# Patient Record
Sex: Female | Born: 1960 | Race: White | Hispanic: No | Marital: Single | Smoking: Former smoker
Health system: Southern US, Community
[De-identification: ages and names within clinical notes are randomized; demographics above are authoritative.]

## PROBLEM LIST (undated history)

## (undated) DIAGNOSIS — Z681 Body mass index (BMI) 19 or less, adult: Secondary | ICD-10-CM

## (undated) DIAGNOSIS — F3289 Other specified depressive episodes: Secondary | ICD-10-CM

## (undated) DIAGNOSIS — J4489 Other specified chronic obstructive pulmonary disease: Secondary | ICD-10-CM

## (undated) DIAGNOSIS — M159 Polyosteoarthritis, unspecified: Secondary | ICD-10-CM

## (undated) DIAGNOSIS — J309 Allergic rhinitis, unspecified: Secondary | ICD-10-CM

## (undated) DIAGNOSIS — K219 Gastro-esophageal reflux disease without esophagitis: Secondary | ICD-10-CM

## (undated) DIAGNOSIS — F329 Major depressive disorder, single episode, unspecified: Secondary | ICD-10-CM

## (undated) DIAGNOSIS — K589 Irritable bowel syndrome without diarrhea: Secondary | ICD-10-CM

## (undated) DIAGNOSIS — F411 Generalized anxiety disorder: Secondary | ICD-10-CM

## (undated) DIAGNOSIS — J449 Chronic obstructive pulmonary disease, unspecified: Secondary | ICD-10-CM

## (undated) DIAGNOSIS — C801 Malignant (primary) neoplasm, unspecified: Secondary | ICD-10-CM

## (undated) HISTORY — PX: OTHER SURGICAL HISTORY: SHX169

## (undated) HISTORY — DX: Major depressive disorder, single episode, unspecified: F32.9

## (undated) HISTORY — DX: Irritable bowel syndrome, unspecified: K58.9

## (undated) HISTORY — DX: Irritable bowel syndrome without diarrhea: K58.9

## (undated) HISTORY — DX: Generalized anxiety disorder: F41.1

## (undated) HISTORY — DX: Other specified chronic obstructive pulmonary disease: J44.89

## (undated) HISTORY — DX: Body mass index (BMI) 19.9 or less, adult: Z68.1

## (undated) HISTORY — DX: Other specified depressive episodes: F32.89

## (undated) HISTORY — DX: Allergic rhinitis, unspecified: J30.9

## (undated) HISTORY — DX: Chronic obstructive pulmonary disease, unspecified: J44.9

## (undated) HISTORY — DX: Gastro-esophageal reflux disease without esophagitis: K21.9

## (undated) HISTORY — DX: Polyosteoarthritis, unspecified: M15.9

## (undated) HISTORY — PX: ABDOMINAL HYSTERECTOMY: SHX81

---

## 1997-08-30 ENCOUNTER — Ambulatory Visit (HOSPITAL_COMMUNITY): Admission: RE | Admit: 1997-08-30 | Discharge: 1997-08-30 | Payer: Self-pay | Admitting: Obstetrics

## 1997-09-26 ENCOUNTER — Encounter: Admission: RE | Admit: 1997-09-26 | Discharge: 1997-12-25 | Payer: Self-pay | Admitting: Obstetrics

## 1997-10-21 ENCOUNTER — Inpatient Hospital Stay (HOSPITAL_COMMUNITY): Admission: AD | Admit: 1997-10-21 | Discharge: 1997-10-24 | Payer: Self-pay | Admitting: *Deleted

## 1998-06-12 ENCOUNTER — Ambulatory Visit (HOSPITAL_COMMUNITY): Admission: RE | Admit: 1998-06-12 | Discharge: 1998-06-12 | Payer: Self-pay | Admitting: Family Medicine

## 1999-07-03 ENCOUNTER — Encounter: Admission: RE | Admit: 1999-07-03 | Discharge: 1999-07-03 | Payer: Self-pay | Admitting: Orthopedic Surgery

## 1999-07-03 ENCOUNTER — Encounter: Payer: Self-pay | Admitting: Orthopedic Surgery

## 1999-07-07 ENCOUNTER — Ambulatory Visit (HOSPITAL_BASED_OUTPATIENT_CLINIC_OR_DEPARTMENT_OTHER): Admission: RE | Admit: 1999-07-07 | Discharge: 1999-07-07 | Payer: Self-pay | Admitting: Orthopedic Surgery

## 2000-05-23 ENCOUNTER — Encounter: Payer: Self-pay | Admitting: Emergency Medicine

## 2000-05-23 ENCOUNTER — Emergency Department (HOSPITAL_COMMUNITY): Admission: EM | Admit: 2000-05-23 | Discharge: 2000-05-23 | Payer: Self-pay | Admitting: Emergency Medicine

## 2000-05-23 ENCOUNTER — Encounter (INDEPENDENT_AMBULATORY_CARE_PROVIDER_SITE_OTHER): Payer: Self-pay

## 2001-03-14 ENCOUNTER — Encounter: Payer: Self-pay | Admitting: Emergency Medicine

## 2001-03-14 ENCOUNTER — Emergency Department (HOSPITAL_COMMUNITY): Admission: EM | Admit: 2001-03-14 | Discharge: 2001-03-14 | Payer: Self-pay | Admitting: Emergency Medicine

## 2001-03-30 ENCOUNTER — Encounter (INDEPENDENT_AMBULATORY_CARE_PROVIDER_SITE_OTHER): Payer: Self-pay | Admitting: *Deleted

## 2001-03-30 ENCOUNTER — Encounter (INDEPENDENT_AMBULATORY_CARE_PROVIDER_SITE_OTHER): Payer: Self-pay | Admitting: Specialist

## 2001-03-30 ENCOUNTER — Encounter: Payer: Self-pay | Admitting: Internal Medicine

## 2001-03-30 ENCOUNTER — Ambulatory Visit (HOSPITAL_COMMUNITY): Admission: RE | Admit: 2001-03-30 | Discharge: 2001-03-30 | Payer: Self-pay | Admitting: Internal Medicine

## 2001-05-04 ENCOUNTER — Ambulatory Visit (HOSPITAL_COMMUNITY): Admission: RE | Admit: 2001-05-04 | Discharge: 2001-05-04 | Payer: Self-pay | Admitting: Internal Medicine

## 2001-05-10 ENCOUNTER — Encounter (INDEPENDENT_AMBULATORY_CARE_PROVIDER_SITE_OTHER): Payer: Self-pay | Admitting: Specialist

## 2001-05-10 ENCOUNTER — Encounter: Payer: Self-pay | Admitting: Internal Medicine

## 2001-05-10 ENCOUNTER — Ambulatory Visit (HOSPITAL_COMMUNITY): Admission: RE | Admit: 2001-05-10 | Discharge: 2001-05-10 | Payer: Self-pay | Admitting: Internal Medicine

## 2001-07-10 ENCOUNTER — Encounter (INDEPENDENT_AMBULATORY_CARE_PROVIDER_SITE_OTHER): Payer: Self-pay | Admitting: *Deleted

## 2001-07-10 ENCOUNTER — Encounter: Payer: Self-pay | Admitting: Thoracic Surgery

## 2001-07-10 ENCOUNTER — Inpatient Hospital Stay (HOSPITAL_COMMUNITY): Admission: RE | Admit: 2001-07-10 | Discharge: 2001-07-14 | Payer: Self-pay | Admitting: Thoracic Surgery

## 2001-07-11 ENCOUNTER — Encounter: Payer: Self-pay | Admitting: Thoracic Surgery

## 2001-07-12 ENCOUNTER — Encounter: Payer: Self-pay | Admitting: Thoracic Surgery

## 2001-07-13 ENCOUNTER — Encounter: Payer: Self-pay | Admitting: Thoracic Surgery

## 2001-07-14 ENCOUNTER — Encounter: Payer: Self-pay | Admitting: Thoracic Surgery

## 2001-07-21 ENCOUNTER — Encounter: Payer: Self-pay | Admitting: Thoracic Surgery

## 2001-07-21 ENCOUNTER — Encounter: Admission: RE | Admit: 2001-07-21 | Discharge: 2001-07-21 | Payer: Self-pay | Admitting: Thoracic Surgery

## 2001-07-31 ENCOUNTER — Encounter: Payer: Self-pay | Admitting: Emergency Medicine

## 2001-07-31 ENCOUNTER — Emergency Department (HOSPITAL_COMMUNITY): Admission: EM | Admit: 2001-07-31 | Discharge: 2001-07-31 | Payer: Self-pay | Admitting: Emergency Medicine

## 2001-08-11 ENCOUNTER — Encounter: Admission: RE | Admit: 2001-08-11 | Discharge: 2001-08-11 | Payer: Self-pay | Admitting: Thoracic Surgery

## 2001-08-11 ENCOUNTER — Encounter: Payer: Self-pay | Admitting: Thoracic Surgery

## 2001-09-19 ENCOUNTER — Emergency Department (HOSPITAL_COMMUNITY): Admission: EM | Admit: 2001-09-19 | Discharge: 2001-09-19 | Payer: Self-pay | Admitting: Emergency Medicine

## 2001-09-19 ENCOUNTER — Encounter: Payer: Self-pay | Admitting: Emergency Medicine

## 2001-11-08 ENCOUNTER — Emergency Department (HOSPITAL_COMMUNITY): Admission: EM | Admit: 2001-11-08 | Discharge: 2001-11-08 | Payer: Self-pay | Admitting: Emergency Medicine

## 2001-11-08 ENCOUNTER — Encounter: Payer: Self-pay | Admitting: Emergency Medicine

## 2002-08-09 ENCOUNTER — Encounter: Admission: RE | Admit: 2002-08-09 | Discharge: 2002-11-07 | Payer: Self-pay

## 2002-11-14 ENCOUNTER — Encounter
Admission: RE | Admit: 2002-11-14 | Discharge: 2003-02-12 | Payer: Self-pay | Admitting: Physical Medicine & Rehabilitation

## 2005-03-23 ENCOUNTER — Ambulatory Visit: Admission: RE | Admit: 2005-03-23 | Discharge: 2005-03-23 | Payer: Self-pay | Admitting: Gynecology

## 2005-04-26 ENCOUNTER — Ambulatory Visit: Payer: Self-pay | Admitting: Oncology

## 2005-05-11 ENCOUNTER — Ambulatory Visit: Admission: RE | Admit: 2005-05-11 | Discharge: 2005-05-11 | Payer: Self-pay | Admitting: Gynecology

## 2005-06-15 ENCOUNTER — Encounter (INDEPENDENT_AMBULATORY_CARE_PROVIDER_SITE_OTHER): Payer: Self-pay | Admitting: Specialist

## 2005-06-15 ENCOUNTER — Inpatient Hospital Stay (HOSPITAL_COMMUNITY): Admission: RE | Admit: 2005-06-15 | Discharge: 2005-06-18 | Payer: Self-pay | Admitting: Obstetrics & Gynecology

## 2005-07-01 ENCOUNTER — Ambulatory Visit: Payer: Self-pay | Admitting: Oncology

## 2005-07-27 ENCOUNTER — Ambulatory Visit: Admission: RE | Admit: 2005-07-27 | Discharge: 2005-07-27 | Payer: Self-pay | Admitting: Gynecology

## 2005-10-22 ENCOUNTER — Ambulatory Visit: Payer: Self-pay | Admitting: Oncology

## 2006-02-04 ENCOUNTER — Ambulatory Visit: Payer: Self-pay | Admitting: Oncology

## 2006-06-10 ENCOUNTER — Ambulatory Visit: Payer: Self-pay | Admitting: Oncology

## 2006-10-04 ENCOUNTER — Ambulatory Visit: Payer: Self-pay | Admitting: Oncology

## 2007-01-28 IMAGING — CR DG CHEST 2V
2 series · 2 of 2 positions shown · non-contrast
Comparison: none

CLINICAL DATA: Carcinoma of the ovary, pre-op. Patient smokes one-half pack per day of cigarettes. No present chest complaint.  Has had previous right lung surgery for fungus infection. 
 CHEST - 2 VIEW: 
 PA and lateral views of the chest are made and are compared to previous studies of 11/08/01 from [HOSPITAL] and show again mild stable diffuse peribronchial thickening. There are also noted post surgical changes right upper lung field with healed fractures of the right 6th and 7th ribs.  The heart and mediastinum are normal.

[view not recorded (1 of 2)]
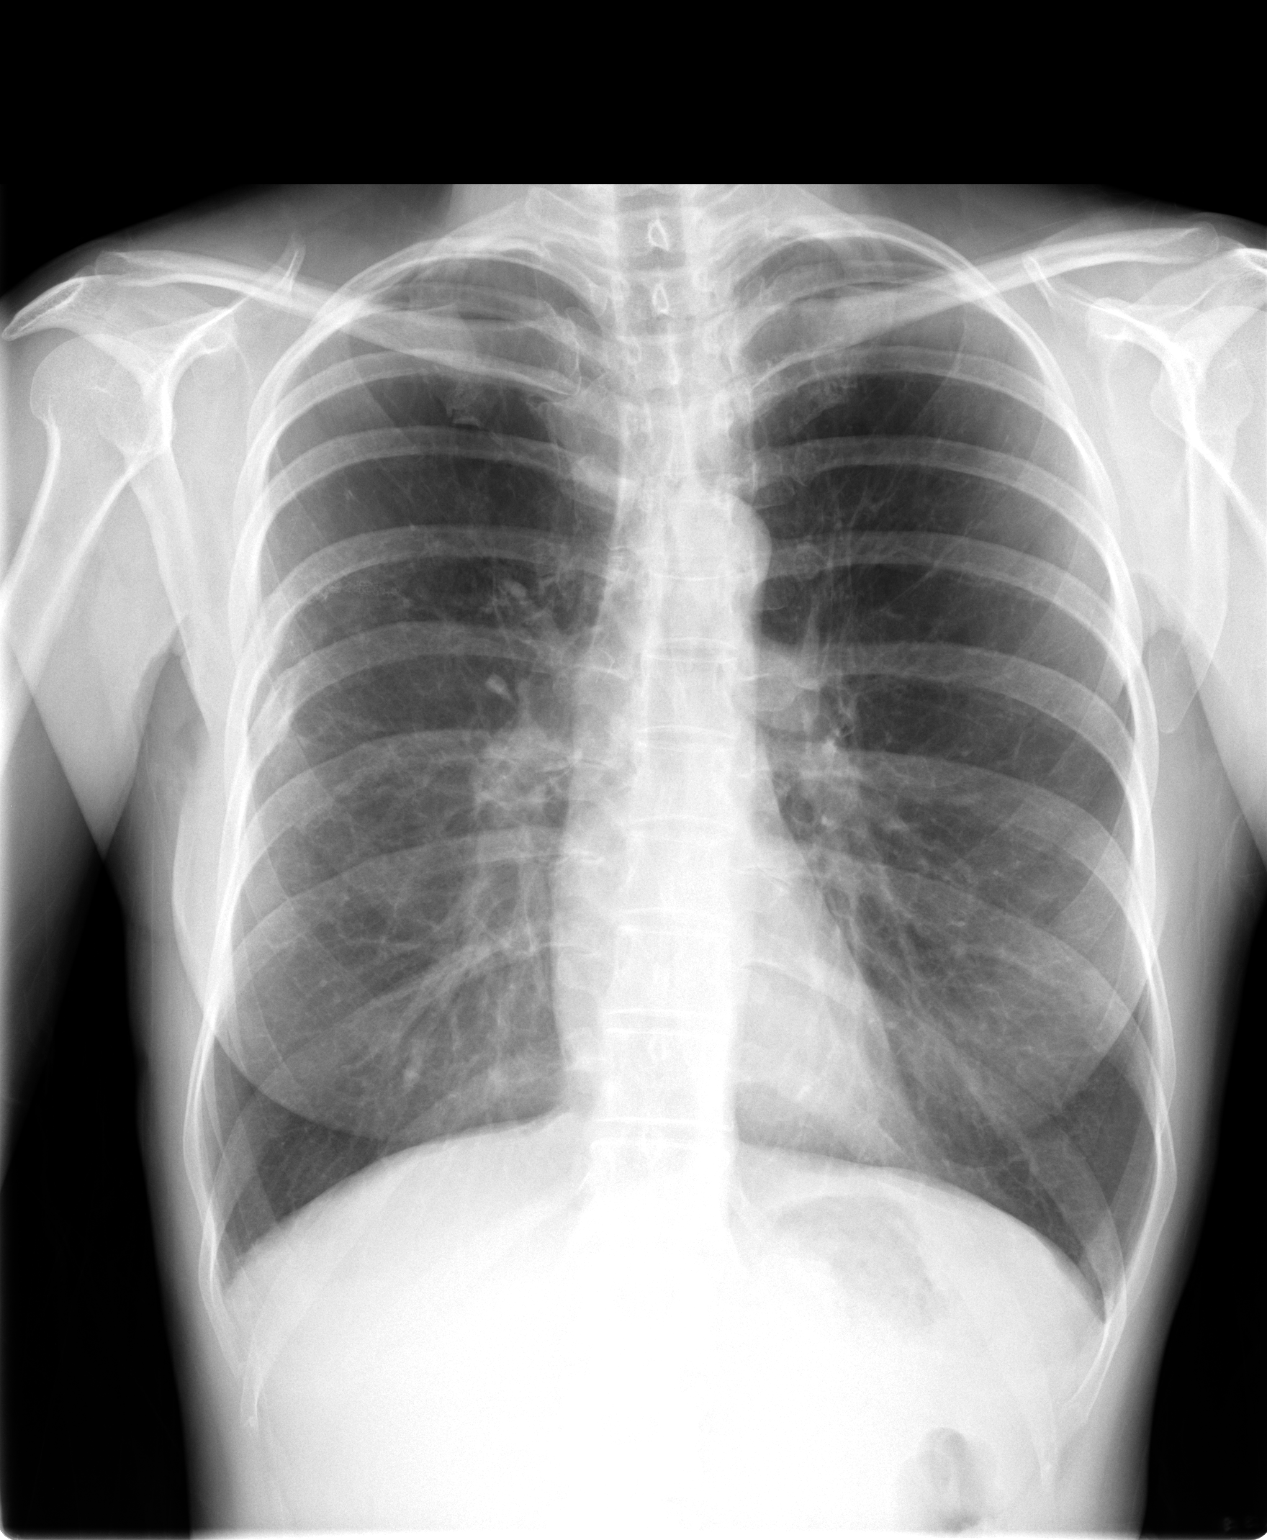

[view not recorded (2 of 2)]
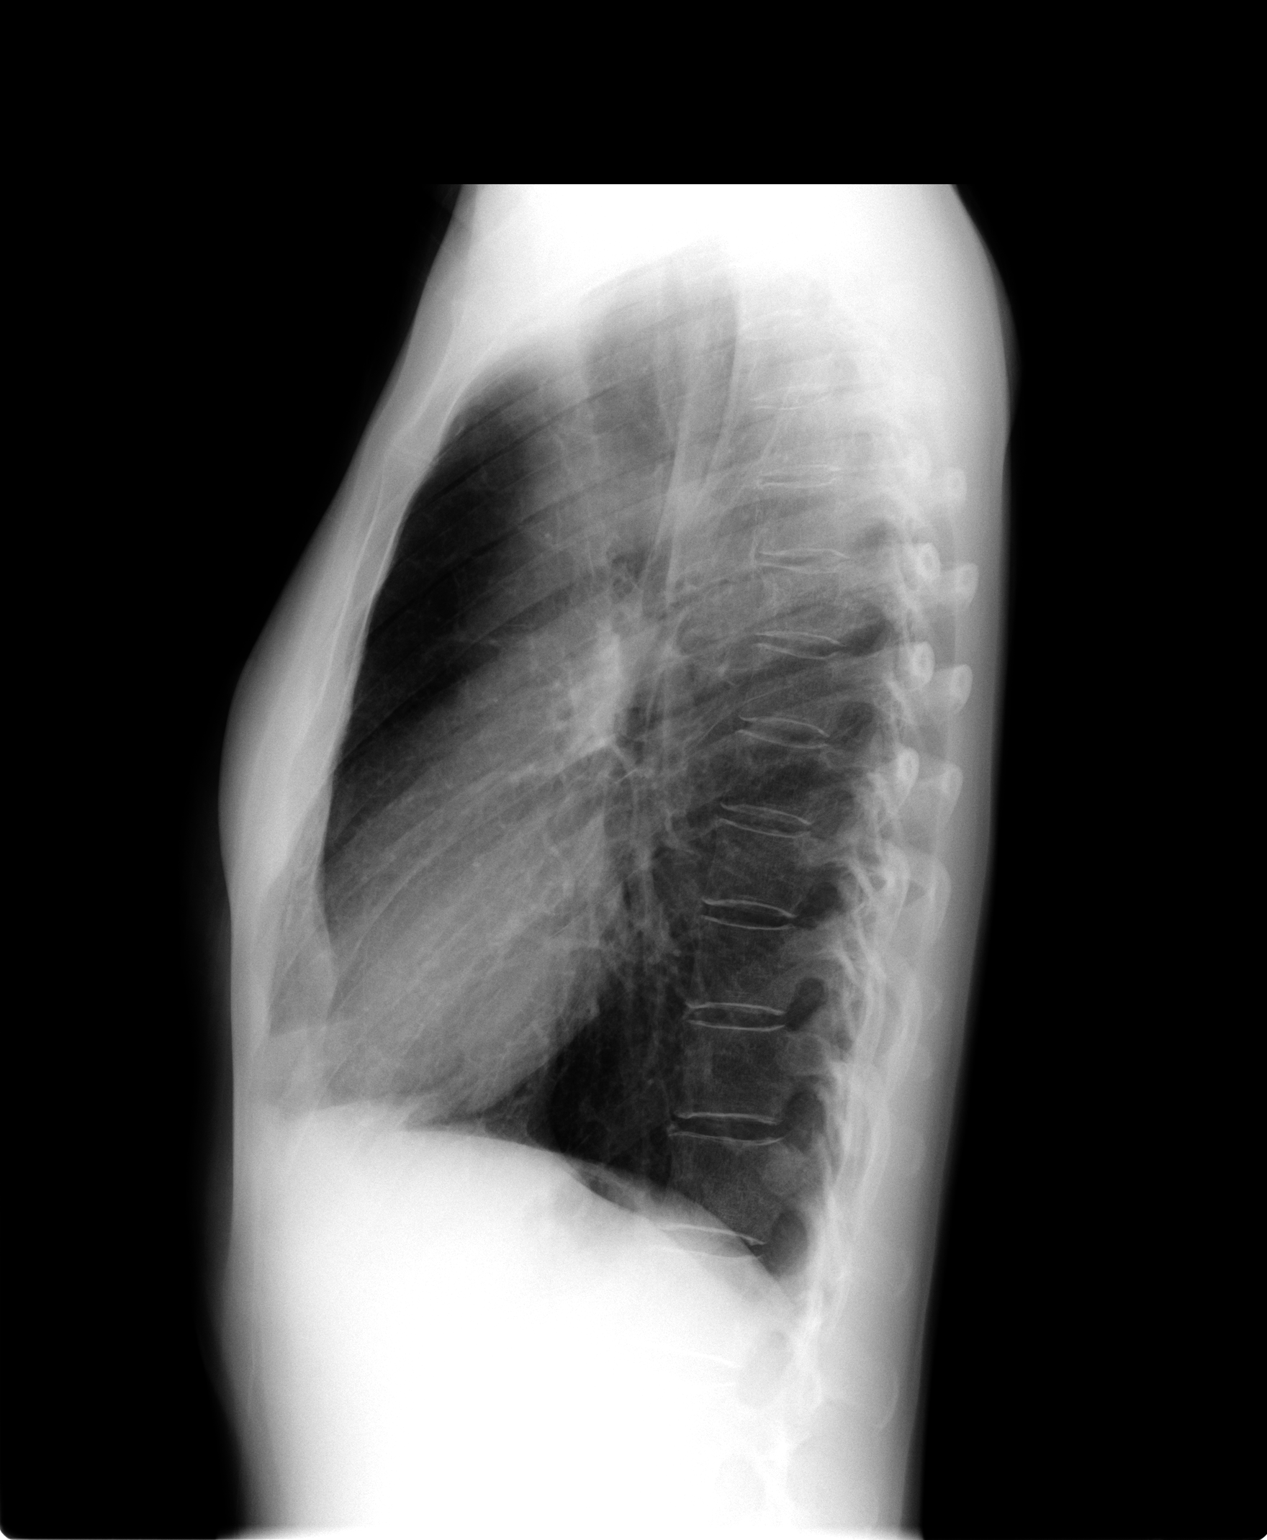

[2 of 2 positions shown; findings below may reference images not displayed]

IMPRESSION: Mild stable diffuse peribronchial thickening.   No evidence of metastatic disease.   No acute cardiac or pulmonary disease.  Old surgical changes, right upper lobe.

## 2007-01-30 ENCOUNTER — Ambulatory Visit: Payer: Self-pay | Admitting: Oncology

## 2010-11-13 NOTE — Consult Note (Signed)
[name], Gloria Wilkerson                ACCOUNT NO.:  [id number]   MEDICAL RECORD NO.:  [id number]          PATIENT TYPE:  OUT   LOCATION:  GYN                          FACILITY:  [facility]   PHYSICIAN:  De Blanch, M.D.DATE OF BIRTH:  [birth year]   DATE OF CONSULTATION:  07/27/2005  DATE OF DISCHARGE:                                   CONSULTATION   A 50 year old white female returns for a postoperative check, having  undergone surgical staging for her ovarian cancer on June 15, 2005.  Final pathology of our re-exploration and staging was entirely negative,  including peritoneal washings, diaphragm scrape, multiple peritoneal  biopsies and 16 pelvic and periaortic lymph nodes.  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  Given this excellent pathology report, we do not  believe any additional therapy is needed.   From a surgical point of view, the patient has had an uncomplicated  postoperative recovery.  She complains of diffuse abdominal pain and a  slight bulge at the apex of her midline incision.  Her appetite is good.  She notes that approximately three weeks ago she developed some discomfort  in both groins area in the inguinal region.  She denies any pelvic pain or  vaginal bleeding.   PHYSICAL EXAMINATION:  VITAL SIGNS:  Weight 97 pounds  GENERAL:  The patient is a healthy, slender, anxious white female in no  acute distress.  HEENT:  Negative.  NECK:  Supple without thyromegaly.  ABDOMEN:  Soft.  Midline incision is healing well.  Examination in the  upright position does note some protrusion at the apex of her midline  incision.  I cannot tell whether this is just alteration because of her  incision or hernia.  In the supine position I do not detect a ,hernia  although it may be small.  She complains of some discomfort as I palpate the  abdomen, no rebound is noted.  There is no inguinal  adenopathy.  The  discomfort she complains of is in the anterior thighs just below the  inguinal region.  PELVIC:  Deferred.   IMPRESSION:  Good postoperative recovery.   The patient does seem to have a chronic pain syndrome.  She is scheduled to  see her primary care physician, Dr. Nedra Hai in [place], on February 7.  In the  interval we will give her prescription for Vicodin one tablet every eight  hours  p.r.n. pain.  Thereafter, we will ask Dr. Nedra Hai to manage her pain, which I do  not believe is related to surgery, and certainly she had much of this prior  to her most recent surgical procedure.  She will return to return in April  for follow-up, and at that time we will obtain a CA-125 value.      De Blanch, M.D.  Electronically Signed     DC/MEDQ  D:  07/27/2005  T:  07/28/2005  Job:  [id number]   cc:   Telford Nab, R.N.  [address]  [place], [place] [address]  Weston Settle, MD  [address]  [place] [address]   Simone Curia  Fax: [phone]   Fransico Michael  Fax: [phone]

## 2010-11-13 NOTE — Discharge Summary (Signed)
Gloria Wilkerson, Gloria Wilkerson                ACCOUNT NO.:  [id number]   MEDICAL RECORD NO.:  [id number]          PATIENT TYPE:  INP   LOCATION:  1619                         FACILITY:  [facility]   PHYSICIAN:  Roseanna Rainbow, M.D.DATE OF BIRTH:  07-01-1960   DATE OF ADMISSION:  06/15/2005  DATE OF DISCHARGE:  06/18/2005                                 DISCHARGE SUMMARY   CHIEF COMPLAINT:  The patient presents for surgical staging of a previously  diagnosed mucinous carcinoma of the ovary.  Please see the dictated history  and physical as per Dr. De Blanch for further details.   HOSPITAL COURSE:  The patient was admitted and underwent an exploratory  laparotomy, pelvic and abdominal peritoneal washings, diaphragm pap smear,  omentectomy, multiple pelvic and peritoneal biopsies, appendectomy, pelvic  and periaortic lymphadenectomies.  Please see the dictated summary as per  Dr. Stanford Breed.  Her postoperative course was remarkable for difficulty  controlling the patient's pain.  She had previously been taking narcotics  preoperatively.  This ultimately was controlled with OxyContin and Percocet  for breakthrough pain.  She is discharged to home on postoperative day #3  tolerating a regular diet.   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   PROCEDURES:  1.  Exploratory laparotomy.  2.  Pelvic and abdominal peritoneal washings.  3.  Diaphragm pap smear.  4.  Omentectomy.  5.  Multiple pelvic and peritoneal biopsies.  6.  Appendectomy.  7.  Pelvic and periaortic lymphadenectomies.   CONDITION:  Stable.   DIET:  Regular.   ACTIVITY:  Progressive.   MEDICATIONS:  Included:  1.  OxyContin.  2.  Percocet.  3.  Ambien.  She was to resume her preoperative medications.   DISPOSITION:  The patient was to follow up with Telford Nab for staple  removal.      Roseanna Rainbow, M.D.  Electronically Signed     LAJ/MEDQ  D:   06/18/2005  T:  06/21/2005  Job:  [id number]   cc:   Telford Nab, R.N.  [address]  [place], [place] [address]   DeQuincy Kirby Funk, MD  [address]  [place] [address]   Fransico Michael  Fax: [phone]
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
## 2010-11-13 NOTE — Procedures (Signed)
[place]. [facility]  Patient:    Gloria Wilkerson, Gloria Wilkerson Visit Number: [id number] MRN: [id number]          Service Type: END Location: ENDO Attending Physician:  Jetty Duhamel Driver Dictated by:   Rennis Chris. Maple Hudson, M.D. Proc. Date: 03/30/01 Admit Date:  03/30/2001                             Procedure Report  PROCEDURE:  Bronchoscopy.  INDICATION FOR PROCEDURE:  A 50 year old white female smoker who presented with cough and self-limited right anterior chest pain.  During workup was found to have a right apical mass.  She has a weak positive PPD with history of exposure remotely to her father, who had TB.  PREOPERATIVE EVALUATION:  No medication allergies.  We took her off of [employer].  Smoking one pack per day.  Clear chest.  Normal heart sounds.  No adenopathy.  Room air oxygen saturation 96%.  Pulmonary function tests show moderate COPD with FEV1 of 52% of predicted.  Bronchoscopy is performed to attempt diagnostic sampling of the right apical mass.  DESCRIPTION OF PROCEDURE:  After fully informed consent, bronchoscopy was performed on an outpatient basis.  Premedication was with Demerol and atropine.  The upper airway was anesthetized topically with Cetacaine spray and 1% Xylocaine.  A cumulative dose of 4 mg of intravenous Versed was required during the procedure for additional sedation and cough control. Oxygen was provided at 8 L/min. by nasal prongs, holding saturation over 90%. Cardiac rhythm was normal sinus.  An Olympus fiberoptic bronchoscope was advanced via the right nostril to the level of the vocal cords without difficulty.  Cough was quite active, but cord anatomy appeared normal.  The trachea and main carina were normal.  Sequential examination of each lobar and segmental airway bilaterally to the fourth division level revealed no endobronchial lesions.  Secretions were thin and mucoid.  With fluoroscopic guidance, the bronchoscope was  directed into the right apex, which washed with saline, brushed for cytology and AFB smears, and then biopsied by standard transbronchial lung biopsy technique.  There was trivial bleeding and no evident pneumothorax on initial portable C-arm fluoroscopy with chest x-ray pending.  No apparent complications.  FINAL IMPRESSION:  Right apical mass could be old scar, tuberculoma, active tuberculosis, or bronchogenic malignancy.  She will be held until stable, then released home with family to office follow-up. Dictated by:   Rennis Chris. Maple Hudson, M.D. Attending Physician:  Jetty Duhamel Driver DD:  95/62/13 TD:  03/31/01 Job: [id number]

## 2010-11-13 NOTE — Op Note (Signed)
Gloria Wilkerson, Gloria Wilkerson                ACCOUNT NO.:  [id number]   MEDICAL RECORD NO.:  [id number]          PATIENT TYPE:  INP   LOCATION:  1619                         FACILITY:  [facility]   PHYSICIAN:  De Blanch, M.D.DATE OF BIRTH:  1961-04-11   DATE OF PROCEDURE:  06/15/2005  DATE OF DISCHARGE:                                 OPERATIVE REPORT   PREOPERATIVE DIAGNOSIS:  Mucinous cystadenocarcinoma of the ovary (unstaged  at initial operation).   POSTOPERATIVE DIAGNOSIS:  Mucinous cystadenocarcinoma of the ovary (unstaged  at initial operation).   PROCEDURE:  Exploratory laparotomy, pelvic and abdominal peritoneal  washings, diaphragm Pap smear, omentectomy, multiple pelvic and peritoneal  biopsies, appendectomy, pelvic and periaortic lymphadenectomy (comprehensive  staging of ovarian cancer).   SURGEON:  De Blanch, M.D.   ASSISTANT:  Roseanna Rainbow, M.D., Telford Nab, R.N.   ANESTHESIA:  General with orotracheal tube.   ESTIMATED BLOOD LOSS:  100 mL.   SURGICAL FINDINGS:  At the time of exploratory laparotomy, the upper abdomen  including the diaphragm, liver capsule, spleen, stomach, omentum were  normal. The small and large bowel appeared normal. The sigmoid colon had  some adherence to the left pelvic side wall from prior surgery. In the  pelvis, there was no evidence of any peritoneal involvement. The pelvic and  periaortic lymph nodes appeared normal as did the appendix.   DESCRIPTION OF PROCEDURE:  The patient was brought to the operating room and  after satisfactory attainment of general anesthesia was placed in the  modified lithotomy position in Vilas stirrups. The anterior abdominal wall,  perineum and vagina were prepped with Betadine, the Foley catheter was  inserted and the patient was draped. The abdomen was entered through a prior  midline incision the prior scar being excised. Peritoneal washings were  obtained and sent to  cytology. The upper abdomen and pelvis were explored  with the above-noted findings. A Bookwalter retractor was assembled and the  omentum exposed. The omentum was removed from the its connection to the  transverse colon using a series of clamps and these pedicles were ligated  with 2-0 Vicryl. The omentum was cut into multiple pieces and submitted to  pathology.   The Bookwalter retractor was repositioned and the pelvis explored. Adhesions  of the sigmoid colon to the left pelvic sidewall were lysed. The bowel was  packed out of the pelvis. The appendix was adherent to the right pelvic  sidewall, adhesions of the appendix were freed. The appendiceal artery was  crossclamped, divided and suture ligated. The appendiceal stump was  crossclamped and excised. The stump was sutured with 2-0 Vicryl. A  pursestring suture of 2-0 silk was placed in the cecum and the appendix  inverted and the pursestring suture tied.   With the pelvis then exposed, the pelvic sidewalls were opened developing  the pararectal and paravesical spaces. The ureter was identified and held  laterally. Pelvic lymphadenectomy was then performed excising all lymph node  bearing tissue overlying the external iliac artery and vein, obturator fossa  and the internal iliac artery. It was noted that the patient  had minimal  fatty tissue and lymph nodes in the pelvis owing to her very slender  habitus. The obturator nerve and vessels were protected throughout the  dissection. Peritoneal biopsies were obtained from the right and left pelvic  sidewall, the bladder flap and the posterior cul-de-sac and submitted as a  separate set of specimens.   Attention was turned to the upper abdomen. Paracolic biopsies of the  peritoneum were obtained from the right and left paracolic gutters.   The aortic bifurcation was exposed by deflecting the small bowel mesentery  to the right. A peritoneal incision was made overlying the right  common  iliac artery and along the aorta. The right ureter was identified and pulled  laterally. Lymph nodes overlying the right side of the aorta and vena cava  were then excised up to several centimeters above the inferior mesenteric  artery. In the course of the dissection, the retroperitoneal portion of the  duodenum was elevated away from the vena cava to gain better exposure.   The dissection was then carried to the left side and the left common iliac  artery was exposed and a slightly enlarged lymph node was excised. The left  ureter was identified and held laterally by a retractor care being taken not  to put excessive tension on the inferior mesenteric artery and sigmoid  mesentery. The lymph nodes of the left common iliac artery and along the  left-sided aorta were then excised. Hemostasis throughout the lymph node  dissection was achieved mostly with cautery and on occasion with a hemoclip.  These lymph nodes were submitted as left periaortic lymph nodes.   A Pap smear was obtained from the left diaphragm and submitted to pathology.  All surgical sites were reinspected for hemostasis and found to be quite  adequate. The pelvis and abdomen were irrigated with copious amounts of warm  saline.   The anterior abdominal wall was closed in layers the first being a running  mass closure using #1 PDS incorporating fascia, muscle and peritoneum. The  subcutaneous tissue was irrigated, hemostasis achieved with cautery. The  prior  scar was further mobilized. The skin was then closed with skin staples. A  dressing was applied, the patient was awakened from anesthesia and taken to  the recovery room in satisfactory condition. Sponge, needle and instrument  counts correct x2.      De Blanch, M.D.  Electronically Signed     DC/MEDQ  D:  06/15/2005  T:  06/16/2005  Job:  [id number]   cc:   Weston Settle, MD  [address]  [place] [address]   Fransico Michael Fax: [phone]   Telford Nab, R.N.  [address]  [place], [place] [address]   Roseanna Rainbow, M.D.  Fax: [phone]

## 2012-06-25 ENCOUNTER — Emergency Department (HOSPITAL_BASED_OUTPATIENT_CLINIC_OR_DEPARTMENT_OTHER)
Admission: EM | Admit: 2012-06-25 | Discharge: 2012-06-25 | Disposition: A | Discharge status: Home / Self Care | Payer: Medicaid Other | Attending: Emergency Medicine | Admitting: Emergency Medicine

## 2012-06-25 ENCOUNTER — Encounter (HOSPITAL_BASED_OUTPATIENT_CLINIC_OR_DEPARTMENT_OTHER): Payer: Self-pay | Admitting: *Deleted

## 2012-06-25 DIAGNOSIS — Z859 Personal history of malignant neoplasm, unspecified: Secondary | ICD-10-CM | POA: Insufficient documentation

## 2012-06-25 DIAGNOSIS — F17200 Nicotine dependence, unspecified, uncomplicated: Secondary | ICD-10-CM | POA: Insufficient documentation

## 2012-06-25 DIAGNOSIS — K089 Disorder of teeth and supporting structures, unspecified: Secondary | ICD-10-CM | POA: Insufficient documentation

## 2012-06-25 DIAGNOSIS — K0889 Other specified disorders of teeth and supporting structures: Secondary | ICD-10-CM

## 2012-06-25 DIAGNOSIS — R6883 Chills (without fever): Secondary | ICD-10-CM | POA: Insufficient documentation

## 2012-06-25 DIAGNOSIS — J449 Chronic obstructive pulmonary disease, unspecified: Secondary | ICD-10-CM | POA: Insufficient documentation

## 2012-06-25 DIAGNOSIS — J4489 Other specified chronic obstructive pulmonary disease: Secondary | ICD-10-CM | POA: Insufficient documentation

## 2012-06-25 HISTORY — DX: Malignant (primary) neoplasm, unspecified: C80.1

## 2012-06-25 HISTORY — DX: Chronic obstructive pulmonary disease, unspecified: J44.9

## 2012-06-25 MED ORDER — IBUPROFEN 600 MG PO TABS: 600.0000 mg | ORAL_TABLET | Freq: Three times a day (TID) | ORAL | Status: AC | PRN

## 2012-06-25 MED ORDER — PENICILLIN V POTASSIUM 500 MG PO TABS: 500.0000 mg | ORAL_TABLET | Freq: Four times a day (QID) | ORAL | Status: AC

## 2012-06-25 MED ORDER — HYDROCODONE-ACETAMINOPHEN 5-325 MG PO TABS: 1.0000 | ORAL_TABLET | ORAL | Status: AC | PRN

## 2012-06-25 MED ORDER — OXYCODONE-ACETAMINOPHEN 5-325 MG PO TABS
1.0000 | ORAL_TABLET | Freq: Once | ORAL | Status: AC
  Administered 2012-06-25: 1 via ORAL
  Filled 2012-06-25 (×2): qty 1

## 2012-06-25 MED ORDER — IBUPROFEN 400 MG PO TABS
600.0000 mg | ORAL_TABLET | Freq: Once | ORAL | Status: AC
  Administered 2012-06-25: 600 mg via ORAL
  Filled 2012-06-25: qty 1

## 2012-06-25 NOTE — ED Notes (Signed)
Pt states she is scheduled to have teeth removed on Friday. Dental pain since Wed.

## 2012-06-25 NOTE — ED Provider Notes (Signed)
History  This chart was scribed for Gloria Co, MD by Ardeen Jourdain, ED Scribe. This patient was seen in room MH03/MH03 and the patient's care was started at 2149.  CSN: [id number]  Arrival date & time 06/25/12  2136   First MD Initiated Contact with Patient 06/25/12 2149      Chief Complaint  Patient presents with  . Dental Pain     The history is provided by the patient. No language interpreter was used.    Gloria Wilkerson is a 51 y.o. female who presents to the Emergency Department complaining of lower jaw and teeth pain with associated chills. She denies any fever, nausea, emesis and diarrhea as associated symptoms. She reports she is having her teeth removed on Friday.  Then as mild/moderate.  Pain is worsened by palpation.  Nothing improves her pain.   Past Medical History  Diagnosis Date  . COPD (chronic obstructive pulmonary disease)   . Cancer     Past Surgical History  Procedure Date  . Abdominal hysterectomy     History reviewed. No pertinent family history.  History  Substance Use Topics  . Smoking status: Current Every Day Smoker  . Smokeless tobacco: Not on file  . Alcohol Use: No   No OB history available.   Review of Systems  All other systems reviewed and are negative.   A complete 10 system review of systems was obtained and all systems are negative except as noted in the HPI and PMH.    Allergies  Review of patient's allergies indicates no known allergies.  Home Medications  No current outpatient prescriptions on file.  Triage Vitals: BP 135/93  Pulse 92  Temp 98.4 F (36.9 C) (Oral)  Resp 20  Ht 5' (1.524 m)  Wt 80 lb (36.288 kg)  BMI 15.62 kg/m2  SpO2 96%  Physical Exam  Nursing note and vitals reviewed. Constitutional: She is oriented to person, place, and time. She appears well-developed and well-nourished.  HENT:  Head: Normocephalic.       Obvious dental decay of 24, 25, 26 No facial swelling, no gingival swelling or  fluctuance.  Oral airway patent.  Tolerating secretions.  Eyes: EOM are normal.  Neck: Normal range of motion.  Cardiovascular: Normal rate.   Pulmonary/Chest: Effort normal.  Abdominal: She exhibits no distension.  Musculoskeletal: Normal range of motion.  Neurological: She is alert and oriented to person, place, and time.  Psychiatric: She has a normal mood and affect.    ED Course  Procedures (including critical care time)  DIAGNOSTIC STUDIES: Oxygen Saturation is 96% on room air, adequate by my interpretation.    COORDINATION OF CARE:   9:54 PM: Discussed treatment plan which includes. Abx, pain meds   Labs Reviewed - No data to display No results found.   1. Pain, dental       MDM  Dental Pain. Home with antibiotics and pain medicine. Recommend dental follow up. No signs of gingival abscess. Tolerating secretions. Airway patent. No sub lingular swelling       I personally performed the services described in this documentation, which was scribed in my presence. The recorded information has been reviewed and is accurate.      Gloria Co, MD 06/25/12 [id number]

## 2012-09-04 ENCOUNTER — Encounter: Payer: Self-pay | Admitting: Internal Medicine

## 2012-09-05 ENCOUNTER — Institutional Professional Consult (permissible substitution): Payer: Self-pay | Admitting: Internal Medicine

## 2012-10-12 ENCOUNTER — Institutional Professional Consult (permissible substitution): Payer: Self-pay | Admitting: Internal Medicine

## 2012-11-30 ENCOUNTER — Institutional Professional Consult (permissible substitution): Payer: Self-pay | Admitting: Internal Medicine

## 2013-01-18 ENCOUNTER — Institutional Professional Consult (permissible substitution): Payer: Self-pay | Admitting: Internal Medicine

## 2013-01-22 ENCOUNTER — Encounter: Payer: Self-pay | Admitting: Internal Medicine

## 2014-02-14 ENCOUNTER — Emergency Department (HOSPITAL_BASED_OUTPATIENT_CLINIC_OR_DEPARTMENT_OTHER)
Admission: EM | Admit: 2014-02-14 | Discharge: 2014-02-14 | Disposition: A | Discharge status: Home / Self Care | Payer: Medicaid Other | Attending: Emergency Medicine | Admitting: Emergency Medicine

## 2014-02-14 ENCOUNTER — Encounter (HOSPITAL_BASED_OUTPATIENT_CLINIC_OR_DEPARTMENT_OTHER): Payer: Self-pay | Admitting: Emergency Medicine

## 2014-02-14 ENCOUNTER — Emergency Department (HOSPITAL_BASED_OUTPATIENT_CLINIC_OR_DEPARTMENT_OTHER): Payer: Medicaid Other

## 2014-02-14 DIAGNOSIS — Y92410 Unspecified street and highway as the place of occurrence of the external cause: Secondary | ICD-10-CM | POA: Diagnosis not present

## 2014-02-14 DIAGNOSIS — Z791 Long term (current) use of non-steroidal anti-inflammatories (NSAID): Secondary | ICD-10-CM | POA: Insufficient documentation

## 2014-02-14 DIAGNOSIS — IMO0002 Reserved for concepts with insufficient information to code with codable children: Secondary | ICD-10-CM | POA: Diagnosis present

## 2014-02-14 DIAGNOSIS — Y9389 Activity, other specified: Secondary | ICD-10-CM | POA: Diagnosis not present

## 2014-02-14 DIAGNOSIS — F17200 Nicotine dependence, unspecified, uncomplicated: Secondary | ICD-10-CM | POA: Diagnosis not present

## 2014-02-14 DIAGNOSIS — J449 Chronic obstructive pulmonary disease, unspecified: Secondary | ICD-10-CM | POA: Insufficient documentation

## 2014-02-14 DIAGNOSIS — M545 Low back pain, unspecified: Secondary | ICD-10-CM

## 2014-02-14 DIAGNOSIS — Z859 Personal history of malignant neoplasm, unspecified: Secondary | ICD-10-CM | POA: Insufficient documentation

## 2014-02-14 DIAGNOSIS — J4489 Other specified chronic obstructive pulmonary disease: Secondary | ICD-10-CM | POA: Insufficient documentation

## 2014-02-14 DIAGNOSIS — Z792 Long term (current) use of antibiotics: Secondary | ICD-10-CM | POA: Diagnosis not present

## 2014-02-14 MED ORDER — CYCLOBENZAPRINE HCL 5 MG PO TABS: 5.0000 mg | ORAL_TABLET | Freq: Two times a day (BID) | ORAL | Status: AC | PRN

## 2014-02-14 NOTE — ED Provider Notes (Signed)
Medical screening examination/treatment/procedure(s) were performed by non-physician practitioner and as supervising physician I was immediately available for consultation/collaboration.   EKG Interpretation None        Wandra Arthurs, MD 02/14/14 [phone]

## 2014-02-14 NOTE — Discharge Instructions (Signed)
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
## 2015-08-17 ENCOUNTER — Emergency Department (HOSPITAL_BASED_OUTPATIENT_CLINIC_OR_DEPARTMENT_OTHER): Payer: Medicaid Other

## 2015-08-17 ENCOUNTER — Emergency Department (HOSPITAL_BASED_OUTPATIENT_CLINIC_OR_DEPARTMENT_OTHER)
Admission: EM | Admit: 2015-08-17 | Discharge: 2015-08-17 | Disposition: A | Discharge status: Other Acute Inpatient Hospital | Payer: Medicaid Other | Attending: Emergency Medicine | Admitting: Emergency Medicine

## 2015-08-17 ENCOUNTER — Encounter (HOSPITAL_BASED_OUTPATIENT_CLINIC_OR_DEPARTMENT_OTHER): Payer: Self-pay | Admitting: *Deleted

## 2015-08-17 DIAGNOSIS — R0602 Shortness of breath: Secondary | ICD-10-CM | POA: Diagnosis present

## 2015-08-17 DIAGNOSIS — R000 Tachycardia, unspecified: Secondary | ICD-10-CM | POA: Diagnosis not present

## 2015-08-17 DIAGNOSIS — J9601 Acute respiratory failure with hypoxia: Secondary | ICD-10-CM

## 2015-08-17 DIAGNOSIS — Z87891 Personal history of nicotine dependence: Secondary | ICD-10-CM | POA: Insufficient documentation

## 2015-08-17 DIAGNOSIS — Z792 Long term (current) use of antibiotics: Secondary | ICD-10-CM | POA: Insufficient documentation

## 2015-08-17 DIAGNOSIS — J441 Chronic obstructive pulmonary disease with (acute) exacerbation: Secondary | ICD-10-CM

## 2015-08-17 DIAGNOSIS — Z859 Personal history of malignant neoplasm, unspecified: Secondary | ICD-10-CM | POA: Insufficient documentation

## 2015-08-17 DIAGNOSIS — Z9981 Dependence on supplemental oxygen: Secondary | ICD-10-CM | POA: Diagnosis not present

## 2015-08-17 LAB — I-STAT CG4 LACTIC ACID, ED: Lactic Acid, Venous: 2.42 mmol/L (ref 0.5–2.0)

## 2015-08-17 LAB — I-STAT ARTERIAL BLOOD GAS, ED
ACID-BASE EXCESS: 17 mmol/L — AB (ref 0.0–2.0)
BICARBONATE: 44.4 meq/L — AB (ref 20.0–24.0)
O2 SAT: 100 %
PCO2 ART: 61.4 mmHg — AB (ref 35.0–45.0)
PO2 ART: 168 mmHg — AB (ref 80.0–100.0)
Patient temperature: 99.5
TCO2: 46 mmol/L (ref 0–100)
pH, Arterial: 7.469 — ABNORMAL HIGH (ref 7.350–7.450)

## 2015-08-17 LAB — CBC
HCT: 44.5 % (ref 36.0–46.0)
Hemoglobin: 14.5 g/dL (ref 12.0–15.0)
MCH: 31.5 pg (ref 26.0–34.0)
MCHC: 32.6 g/dL (ref 30.0–36.0)
MCV: 96.5 fL (ref 78.0–100.0)
PLATELETS: 562 10*3/uL — AB (ref 150–400)
RBC: 4.61 MIL/uL (ref 3.87–5.11)
RDW: 12.9 % (ref 11.5–15.5)
WBC: 15 10*3/uL — AB (ref 4.0–10.5)

## 2015-08-17 LAB — BASIC METABOLIC PANEL
ANION GAP: 13 (ref 5–15)
BUN: 12 mg/dL (ref 6–20)
CALCIUM: 9.2 mg/dL (ref 8.9–10.3)
CO2: 36 mmol/L — ABNORMAL HIGH (ref 22–32)
CREATININE: 0.49 mg/dL (ref 0.44–1.00)
Chloride: 91 mmol/L — ABNORMAL LOW (ref 101–111)
GFR calc Af Amer: >60 mL/min (ref >60)
GLUCOSE: 188 mg/dL — AB (ref 65–99)
Potassium: 3.3 mmol/L — ABNORMAL LOW (ref 3.5–5.1)
Sodium: 140 mmol/L (ref 135–145)

## 2015-08-17 LAB — TROPONIN I: Troponin I: <0.03 ng/mL (ref <0.031)

## 2015-08-17 MED ORDER — LEVOFLOXACIN IN D5W 750 MG/150ML IV SOLN
750.0000 mg | INTRAVENOUS | Status: DC
  Administered 2015-08-17: 750 mg via INTRAVENOUS
  Filled 2015-08-17: qty 150

## 2015-08-17 MED ORDER — SODIUM CHLORIDE 0.9 % IV BOLUS (SEPSIS)
250.0000 mL | Freq: Once | INTRAVENOUS | Status: AC
  Administered 2015-08-17: 250 mL via INTRAVENOUS

## 2015-08-17 MED ORDER — LORAZEPAM 2 MG/ML IJ SOLN
0.5000 mg | Freq: Once | INTRAMUSCULAR | Status: AC
  Administered 2015-08-17: 0.5 mg via INTRAVENOUS
  Filled 2015-08-17: qty 1

## 2015-08-17 MED ORDER — METHYLPREDNISOLONE SODIUM SUCC 125 MG IJ SOLR
125.0000 mg | Freq: Once | INTRAMUSCULAR | Status: AC
  Administered 2015-08-17: 125 mg via INTRAVENOUS
  Filled 2015-08-17: qty 2

## 2015-08-17 MED ORDER — IPRATROPIUM-ALBUTEROL 0.5-2.5 (3) MG/3ML IN SOLN
RESPIRATORY_TRACT | Status: AC
  Administered 2015-08-17: 17:00:00
  Filled 2015-08-17: qty 3

## 2015-08-17 MED ORDER — ALBUTEROL SULFATE (2.5 MG/3ML) 0.083% IN NEBU: 5.0000 mg | INHALATION_SOLUTION | Freq: Once | RESPIRATORY_TRACT | Status: DC

## 2015-08-17 MED ORDER — ALBUTEROL SULFATE (2.5 MG/3ML) 0.083% IN NEBU
INHALATION_SOLUTION | RESPIRATORY_TRACT | Status: AC
  Administered 2015-08-17: 17:00:00
  Filled 2015-08-17: qty 3

## 2015-08-17 MED ORDER — IPRATROPIUM BROMIDE 0.02 % IN SOLN: 0.5000 mg | Freq: Once | RESPIRATORY_TRACT | Status: DC

## 2015-08-17 MED ORDER — ALBUTEROL (5 MG/ML) CONTINUOUS INHALATION SOLN
INHALATION_SOLUTION | RESPIRATORY_TRACT | Status: AC
  Administered 2015-08-17: 10 mg/h
  Filled 2015-08-17: qty 20

## 2015-08-17 MED ORDER — MORPHINE SULFATE (PF) 2 MG/ML IV SOLN
2.0000 mg | Freq: Once | INTRAVENOUS | Status: AC
  Administered 2015-08-17: 2 mg via INTRAVENOUS
  Filled 2015-08-17: qty 1

## 2015-08-17 MED ORDER — ACETAMINOPHEN 325 MG PO TABS
650.0000 mg | ORAL_TABLET | Freq: Once | ORAL | Status: AC
  Administered 2015-08-17: 650 mg via ORAL
  Filled 2015-08-17: qty 2

## 2015-08-17 NOTE — ED Notes (Signed)
Pt restless, agitated at times, EDP informed, medication ordered

## 2015-08-17 NOTE — ED Notes (Signed)
MD at bedside. 

## 2015-08-17 NOTE — ED Provider Notes (Signed)
CSN: [id number]     Arrival date & time 08/17/15  1648 History  By signing my name below, I, Doran Stabler, attest that this documentation has been prepared under the direction and in the presence of No att. providers found. Electronically Signed: Doran Stabler, ED Scribe. 08/17/2015. 4:58 PM.  Chief Complaint  Patient presents with  . Shortness of Breath  Patient is a 55 y.o. female presenting with shortness of breath. The history is provided by the patient. No language interpreter was used.  Shortness of Breath Severity:  Moderate Onset quality:  Sudden Timing:  Constant Progression:  Worsening Relieved by:  Nothing Worsened by:  Nothing tried Associated symptoms: no fever and no vomiting    HPI Comments: A LEVEL 5 CAVEAT PERTAINS DUE TO URGENT NEED FOR INTERVENTION Gloria Wilkerson is a 55 y.o. female with a PMHx of COPD who presents to the Emergency Department complaining of constant SOB that worsened prior to arrival. Pt states she is on 2L of oxygen at home. Pt states she is usually admitted to [facility] for her COPD exacerbations. Pt denies any fevers or any other symptoms at this time.    Past Medical History  Diagnosis Date  . COPD (chronic obstructive pulmonary disease) ([place])   . Cancer [facility])    Past Surgical History  Procedure Laterality Date  . Abdominal hysterectomy     No family history on file. Social History  Substance Use Topics  . Smoking status: Former Smoker -- 0.50 packs/day    Types: Cigarettes  . Smokeless tobacco: None  . Alcohol Use: No   OB History    No data available     Review of Systems  Constitutional: Negative for fever.  Respiratory: Positive for shortness of breath.   Gastrointestinal: Negative for nausea and vomiting.  Psychiatric/Behavioral: Negative for confusion.  All other systems reviewed and are negative.  ROS-  ROS ENTERED IN ERROR BY SCRIBE- UNABLE TO OBTAIN COMPLETE ROS DUE TO LEVEL 5 CAVEAT Allergies  Review of  patient's allergies indicates no known allergies.  Home Medications   Prior to Admission medications   Medication Sig Start Date End Date Taking? Authorizing Provider  cyclobenzaprine (FLEXERIL) 5 MG tablet Take 1 tablet (5 mg total) by mouth 2 (two) times daily as needed for muscle spasms. 02/14/14   Glendell Docker, NP  HYDROcodone-acetaminophen (NORCO/VICODIN) 5-325 MG per tablet Take 1 tablet by mouth every 4 (four) hours as needed for pain. 06/25/12   Jola Schmidt, MD  ibuprofen (ADVIL,MOTRIN) 600 MG tablet Take 1 tablet (600 mg total) by mouth every 8 (eight) hours as needed for pain. 06/25/12   Jola Schmidt, MD  penicillin v potassium (VEETID) 500 MG tablet Take 1 tablet (500 mg total) by mouth 4 (four) times daily. 06/25/12   Jola Schmidt, MD   BP 104/69 mmHg  Pulse 150  Temp(Src) 99.5 F (37.5 C)  Resp 36  SpO2 100%  Vitals reviewed Physical Exam  Physical Examination: General appearance - pt is is distress, chronically ill appearing Mental status - alert, oriented to person, place, and time Eyes - pupils equal and reactive, extraocular eye movements intact Mouth - mucous membranes moist, pharynx normal without lesions Chest - decreased air movement, + accessory muscle use, BSS, pt very dyspneic, speaking in 1-2 word sentences Heart - tachycardic rate, regular rhythm, normal S1, S2, no murmurs, rubs, clicks or gallops Abdomen - soft, nontender, nondistended, no masses or organomegaly Neurological - alert, oriented, normal speech, Extremities -  peripheral pulses normal, no pedal edema, no clubbing or cyanosis Skin - normal coloration and turgor, no rashes  ED Course  Procedures   DIAGNOSTIC STUDIES: Oxygen Saturation is 76% on room air, low by my interpretation.    COORDINATION OF CARE: 4:56 PM Will give breathing treatment. Will order CXR, blood work, EKG, and BiPAP. Discussed treatment plan with pt at bedside and pt agreed to plan.  Labs Review Labs Reviewed  CBC -  Abnormal; Notable for the following:    WBC 15.0 (*)    Platelets 562 (*)    All other components within normal limits  BASIC METABOLIC PANEL - Abnormal; Notable for the following:    Potassium 3.3 (*)    Chloride 91 (*)    CO2 36 (*)    Glucose, Bld 188 (*)    All other components within normal limits  I-STAT ARTERIAL BLOOD GAS, ED - Abnormal; Notable for the following:    pH, Arterial 7.469 (*)    pCO2 arterial 61.4 (*)    pO2, Arterial 168.0 (*)    Bicarbonate 44.4 (*)    Acid-Base Excess 17.0 (*)    All other components within normal limits  I-STAT CG4 LACTIC ACID, ED - Abnormal; Notable for the following:    Lactic Acid, Venous 2.42 (*)    All other components within normal limits  CULTURE, BLOOD (ROUTINE X 2)  CULTURE, BLOOD (ROUTINE X 2)  TROPONIN I  I-STAT CG4 LACTIC ACID, ED  I-STAT CG4 LACTIC ACID, ED    Imaging Review Dg Chest Portable 1 View  08/17/2015  CLINICAL DATA:  Constant shortness of breath, worsening today. On home O2. History of COPD. EXAM: PORTABLE CHEST 1 VIEW COMPARISON:  None. FINDINGS: 1655 hours. Mild patient rotation to the right. The heart size and mediastinal contours are normal. The lungs are hyperinflated with diffusely increased interstitial markings. There is asymmetric right suprahilar airspace disease. There is also questionable left perihilar nodularity. No pneumothorax or significant pleural effusion. The bones appear unchanged. IMPRESSION: Evidence of chronic obstructive lung disease with possible superimposed bilateral airspace opacities, suspicious for pneumonia. Follow up PA and lateral chest X-ray is recommended in 3-4 weeks following trial of antibiotic therapy to ensure resolution and exclude underlying malignancy. Electronically Signed   By: Richardean Sale M.D.   On: 08/17/2015 17:18   I have personally reviewed and evaluated these images and lab results as part of my medical decision-making.   EKG Interpretation None        CRITICAL CARE Performed by: Threasa Beards Total critical care time: 75 minutes Critical care time was exclusive of separately billable procedures and treating other patients. Critical care was necessary to treat or prevent imminent or life-threatening deterioration. Critical care was time spent personally by me on the following activities: development of treatment plan with patient and/or surrogate as well as nursing, discussions with consultants, evaluation of patient's response to treatment, examination of patient, obtaining history from patient or surrogate, ordering and performing treatments and interventions, ordering and review of laboratory studies, ordering and review of radiographic studies, pulse oximetry and re-evaluation of patient's condition. ED ECG REPORT   Date: 08/17/2015  Rate: 151  Rhythm: sinus tachycardia  QRS Axis: normal  Intervals: normal  ST/T Wave abnormalities: nonspecific ST/T changes  Conduction Disutrbances:none  Narrative Interpretation: significant artifact in all leads  Old EKG Reviewed: none available   MDM   Final diagnoses:  COPD exacerbation (HCC)  Acute respiratory failure with hypoxia ([place])  Pt presenting in acute respiratory distress, pt very dyspneic on arrival, decreased air movement and signfcant accessory muscle use.  Pt placed on monitor, IV access obtained, O2 provided for O2 sats in the 70s.  See notes below  5:17 PM pt seen immediately upon arrival.  She is now on bipap and appears more comfortable.  She has received solumedrol, she is on continuous albuterol now.   5:33 PM pt continues to improve in terms of work of breathing on bipap.  O2 weaned to 60% fio2, maintaining 100% O2 sat. Will continue to wean if able.  D/w nursing supervisor at [facility]- will have ICU physician call me back.  Patient states [facility] is where she is usually admitted.   5:46 PM d/w Dr. Verdie Mosher, ICU at [facility], he states patient does not need ICU  bed.  recommneds paging hospitalist.  D/w nursing supervisor and will contact hospitalist.    5:57 PM d/w Dr. Lorelle Gibbs, hospitalist at [facility]- he accepts patient but would like for her to go the ED and he will see her there to decide what bed type she will need.   6:25 PM d/w Dr. Louie Bun, ER physician at [facility].  She is aware of patient coming.   I personally performed the services described in this documentation, which was scribed in my presence. The recorded information has been reviewed and is accurate.     Alfonzo Beers, MD 08/17/15 2046

## 2015-08-17 NOTE — ED Notes (Signed)
Pt having prod coughs of thick green sputum, sm amt

## 2015-08-17 NOTE — ED Notes (Addendum)
Pt placed on BiPAP 12/6 w/ FiO2 at 1.0 Set by RT staff

## 2015-08-17 NOTE — ED Notes (Signed)
Cont on BiPAP:  IPAP=12 EPAP = 6 FiO2 0.4

## 2015-08-17 NOTE — ED Notes (Signed)
12 lead ECG obtained - to EDP immediately

## 2015-08-17 NOTE — ED Notes (Signed)
[facility] here for transfer, no changes.

## 2015-08-17 NOTE — ED Notes (Signed)
Family at bedside. 

## 2015-08-17 NOTE — ED Notes (Signed)
BiPAP FiO2 decreased to .60

## 2015-08-17 NOTE — ED Notes (Signed)
CAT tx continues as ordered by EDP

## 2015-08-17 NOTE — ED Notes (Addendum)
Pt alert, restless, shaky, NAD, interactive, denies pain, tolerating Bipap, HR 167, given tylenol, ativan and NS 250cc bolus. RT at [facility]. Pending arrival of [facility] for transfer to [facility] ED.  VSS.

## 2015-08-17 NOTE — ED Notes (Signed)
FiO2 decreased to 0.4

## 2015-08-17 NOTE — ED Notes (Signed)
Pt appears to be more comfortable after initiating BiPAP

## 2015-08-17 NOTE — ED Notes (Signed)
SOB x 3 days- HX COPD- tripoding upon arrival- taken to room 1 for triage- Os sats 76%

## 2015-08-17 NOTE — ED Notes (Signed)
Patient brought back from waiting area in obvious distress. BBS almost absent, SAT 76% on RA. Placed on 100% NRB and neb treatment started. Patient then placed on BiPAP 12/6, BUR 12, 60%. 10mg  Albuterol CAT started at that time through BiPAP. RT administered all neb treatments, but patient and medications were scanned under RN's name. Patient seems to be a bit more comfortable. RT to monitor.

## 2015-08-21 ENCOUNTER — Encounter: Payer: Self-pay | Admitting: Internal Medicine

## 2015-08-22 LAB — CULTURE, BLOOD (ROUTINE X 2)
Culture: NO GROWTH
Culture: NO GROWTH

## 2016-05-28 DEATH — deceased
